# Patient Record
Sex: Male | Born: 1975 | Race: Black or African American | Hispanic: No | Marital: Single | Smoking: Current every day smoker
Health system: Southern US, Community
[De-identification: ages and names within clinical notes are randomized; demographics above are authoritative.]

## PROBLEM LIST (undated history)

## (undated) DIAGNOSIS — I10 Essential (primary) hypertension: Secondary | ICD-10-CM

## (undated) HISTORY — PX: FOOT SURGERY: SHX648

---

## 2012-10-16 ENCOUNTER — Encounter (HOSPITAL_BASED_OUTPATIENT_CLINIC_OR_DEPARTMENT_OTHER): Payer: Self-pay | Admitting: Emergency Medicine

## 2012-10-16 ENCOUNTER — Emergency Department (HOSPITAL_BASED_OUTPATIENT_CLINIC_OR_DEPARTMENT_OTHER)
Admission: EM | Admit: 2012-10-16 | Discharge: 2012-10-16 | Disposition: A | Discharge status: Home / Self Care | Payer: Self-pay | Attending: Emergency Medicine | Admitting: Emergency Medicine

## 2012-10-16 DIAGNOSIS — F17200 Nicotine dependence, unspecified, uncomplicated: Secondary | ICD-10-CM | POA: Insufficient documentation

## 2012-10-16 DIAGNOSIS — H109 Unspecified conjunctivitis: Secondary | ICD-10-CM | POA: Insufficient documentation

## 2012-10-16 MED ORDER — DIPHENHYDRAMINE HCL 12.5 MG/5ML PO ELIX
12.5000 mg | ORAL_SOLUTION | Freq: Once | ORAL | Status: AC
  Administered 2012-10-16: 25 mg via ORAL
  Filled 2012-10-16: qty 10

## 2012-10-16 MED ORDER — FLUORESCEIN SODIUM 1 MG OP STRP
1.0000 | ORAL_STRIP | Freq: Once | OPHTHALMIC | Status: AC
  Administered 2012-10-16: 1 via OPHTHALMIC
  Filled 2012-10-16: qty 1

## 2012-10-16 MED ORDER — TETRACAINE HCL 0.5 % OP SOLN
1.0000 [drp] | Freq: Once | OPHTHALMIC | Status: AC
  Administered 2012-10-16: 1 [drp] via OPHTHALMIC
  Filled 2012-10-16: qty 4

## 2012-10-16 MED ORDER — CIPROFLOXACIN HCL 0.3 % OP SOLN: 1.0000 [drp] | OPHTHALMIC | Status: DC

## 2012-10-16 NOTE — ED Notes (Signed)
Left eye is painful and itching. Wakes with drainage in the mornings.

## 2012-10-16 NOTE — ED Provider Notes (Signed)
CSN: [id number]     Arrival date & time 10/16/12  1457 History   First MD Initiated Contact with Patient 10/16/12 1516     Chief Complaint  Patient presents with  . Conjunctivitis    HPI  Ethan Marshall is a 37 y.o. male with a no PMH who presents to the ED for evaluation of conjunctivits.  History was provided by the patient.  Patient states that he has had itching and discomfort in his left eye for the past 2 or 3 days. He denies any severe eye pain or photophobia. He has mild blurry vision in his left eye.  He states he woke up with clear discharge and denies any purulent discharge. He denies any dry or increased tearing. He states he's been rubbing his eyes. He was giving saline drops at work which he has been taking with no relief in his symptoms. He denies any other treatments. He denies any trauma to the eye or possible foreign body. He does not wear glasses or contacts.  He states he has a history of seasonal allergies however her symptoms today are not similar. He denies any other associated symptoms including fever, chills, rhinorrhea, congestion, sore throat, ear pain, headache or neck pain. He states he had similar symptoms a few years ago and was prescribed some antibiotics and his symptoms improved.     History reviewed. No pertinent past medical history. History reviewed. No pertinent past surgical history. History reviewed. No pertinent family history. History  Substance Use Topics  . Smoking status: Current Every Day Smoker -- 1.00 packs/day    Types: Cigarettes  . Smokeless tobacco: Not on file  . Alcohol Use: Yes    Review of Systems  Constitutional: Negative for fever, chills, activity change, appetite change and fatigue.  HENT: Negative for ear pain, rhinorrhea and sore throat.   Eyes: Positive for discharge, redness, itching and visual disturbance. Negative for photophobia and pain.  Respiratory: Negative for cough and shortness of breath.   Cardiovascular:  Negative for chest pain.  Gastrointestinal: Negative for nausea, vomiting and abdominal pain.  Genitourinary: Negative for dysuria.  Musculoskeletal: Negative for back pain, gait problem, myalgias and neck pain.  Skin: Negative for rash and wound.  Neurological: Negative for dizziness, syncope, weakness, numbness and headaches.    Allergies  Review of patient's allergies indicates no known allergies.  Home Medications  No current outpatient prescriptions on file. BP 138/94  Pulse 83  Temp(Src) 98.7 F (37.1 C) (Oral)  Resp 20  Ht 5\' 11"  (1.803 m)  Wt 230 lb (104.327 kg)  BMI 32.09 kg/m2  SpO2 98%  Filed Vitals:   10/16/12 1508  BP: 138/94  Pulse: 83  Temp: 98.7 F (37.1 C)  TempSrc: Oral  Resp: 20  Height: 5\' 11"  (1.803 m)  Weight: 230 lb (104.327 kg)  SpO2: 98%    Physical Exam  Nursing note and vitals reviewed. Constitutional: He is oriented to person, place, and time. He appears well-developed and well-nourished. No distress.  HENT:  Head: Normocephalic and atraumatic.  Right Ear: External ear normal.  Left Ear: External ear normal.  Nose: Nose normal.  Mouth/Throat: Oropharynx is clear and moist. No oropharyngeal exudate.  TM's gray and translucent bilaterally  Eyes: EOM are normal. Pupils are equal, round, and reactive to light. Right eye exhibits no discharge. Left eye exhibits no discharge.  Sclera injected in the left eye throughout. No palpable masses to the eyelids of the left eye. No pain with eye  movement. No periorbital edema. No evidence of foreign body. No eye drainage.   Neck: Normal range of motion. Neck supple.  Cardiovascular: Normal rate, regular rhythm, normal heart sounds and intact distal pulses.  Exam reveals no gallop and no friction rub.   No murmur heard. Pulmonary/Chest: Effort normal and breath sounds normal. No respiratory distress. He has no wheezes. He has no rales. He exhibits no tenderness.  Abdominal: Soft. Bowel sounds are  normal. He exhibits no distension. There is no tenderness.  Musculoskeletal: Normal range of motion. He exhibits no edema and no tenderness.  Neurological: He is alert and oriented to person, place, and time.  Skin: Skin is warm and dry. He is not diaphoretic.    ED Course  Procedures (including critical care time) Labs Review Labs Reviewed - No data to display Imaging Review No results found.  EKG Interpretation   None       MDM   1. Conjunctivitis of left eye     Ethan Marshall is a 37 y.o. male with a no PMH who presents to the ED for evaluation of conjunctivits. Tetracaine drops ordered. Fluorescein strips. Benadryl ordered for pruritis.    Rechecks  4:11 PM = Visual Acuity: Bilateral Near: 20/70.  R: near 20/70 ; L Near: 20/100  4:30 PM = Woods lamp performed at bedside. Diffuse uptake to the inferior portion of the left eye due to irritation. No evidence of a corneal abrasion. Pruritis not improved.    Patient likely has conjunctivitis with bacterial vs viral etiology.  Patient was afebrile, non-toxic, and had no eyelid swelling or pain with eye movement.  Patient was prescribed Ciloxan for outpatient management.  He was given referral to ophthalmology and instructed to make an appointment for follow-up. Patient was instructed to return to the ED if they experience any fever, spreading redness/swelling, pain with eye movement, or other concerns.  Patient was in agreement with discharge and plan.     Final impressions: 1. Conjunctivitis, left      Ethan Iron PA-C   This patient was discussed with Dr. Anitra Lauth who evaluated the patient         Ethan Ledger, PA-C 10/18/12 1243

## 2012-10-16 NOTE — ED Notes (Signed)
Pt c/o left eye itching, bunring w drainage onset Saturday  Denies inj

## 2012-10-22 NOTE — ED Provider Notes (Signed)
Medical screening examination/treatment/procedure(s) were performed by non-physician practitioner and as supervising physician I was immediately available for consultation/collaboration.   Gwyneth Sprout, MD 10/22/12 [phone]

## 2012-11-18 ENCOUNTER — Emergency Department (HOSPITAL_BASED_OUTPATIENT_CLINIC_OR_DEPARTMENT_OTHER): Payer: Self-pay

## 2012-11-18 ENCOUNTER — Emergency Department (HOSPITAL_BASED_OUTPATIENT_CLINIC_OR_DEPARTMENT_OTHER)
Admission: EM | Admit: 2012-11-18 | Discharge: 2012-11-18 | Disposition: A | Discharge status: Home / Self Care | Payer: Self-pay | Attending: Emergency Medicine | Admitting: Emergency Medicine

## 2012-11-18 ENCOUNTER — Encounter (HOSPITAL_BASED_OUTPATIENT_CLINIC_OR_DEPARTMENT_OTHER): Payer: Self-pay | Admitting: Emergency Medicine

## 2012-11-18 DIAGNOSIS — M79609 Pain in unspecified limb: Secondary | ICD-10-CM | POA: Insufficient documentation

## 2012-11-18 DIAGNOSIS — M79642 Pain in left hand: Secondary | ICD-10-CM

## 2012-11-18 DIAGNOSIS — F17200 Nicotine dependence, unspecified, uncomplicated: Secondary | ICD-10-CM | POA: Insufficient documentation

## 2012-11-18 MED ORDER — IBUPROFEN 800 MG PO TABS: 800.0000 mg | ORAL_TABLET | Freq: Three times a day (TID) | ORAL | Status: DC

## 2012-11-18 NOTE — ED Provider Notes (Signed)
  Medical screening examination/treatment/procedure(s) were performed by non-physician practitioner and as supervising physician I was immediately available for consultation/collaboration.  EKG Interpretation   None          Fadia Marlar, MD 11/18/12 1929 

## 2012-11-18 NOTE — ED Provider Notes (Signed)
CSN: [id number]     Arrival date & time 11/18/12  1417 History   First MD Initiated Contact with Patient 11/18/12 1523     Chief Complaint  Patient presents with  . Hand Pain   (Consider location/radiation/quality/duration/timing/severity/associated sxs/prior Treatment) Patient is a 37 y.o. male presenting with hand pain. The history is provided by the patient. No language interpreter was used.  Hand Pain The current episode started more than 1 month ago. The problem has been gradually worsening. Pertinent negatives include no chills or fever. Associated symptoms comments: Left hand discomfort with mild swelling affecting wrist and tingling into digits 1-3 of hand. No weakness. He does Building surveyor work, is right hand dominant..    No past medical history on file. Past Surgical History  Procedure Laterality Date  . Foot surgery     No family history on file. History  Substance Use Topics  . Smoking status: Current Every Day Smoker -- 1.00 packs/day    Types: Cigarettes  . Smokeless tobacco: Not on file  . Alcohol Use: Yes    Review of Systems  Constitutional: Negative for fever and chills.  Musculoskeletal: Negative.        See HPI  Skin: Negative.   Neurological: Negative.     Allergies  Review of patient's allergies indicates no known allergies.  Home Medications  No current outpatient prescriptions on file. BP 149/84  Pulse 72  Temp(Src) 98 F (36.7 C) (Oral)  Resp 16  Wt 225 lb (102.059 kg)  SpO2 99% Physical Exam  Constitutional: He is oriented to person, place, and time. He appears well-developed and well-nourished.  Neck: Normal range of motion.  Pulmonary/Chest: Effort normal.  Abdominal: Soft.  Musculoskeletal: Normal range of motion.  Left hand unremarkable in appearance. No swelling, discoloration. FROM. Phalen's and Tinnel's tests negative.   Neurological: He is alert and oriented to person, place, and time.  Skin: Skin is warm and dry.  Psychiatric:  He has a normal mood and affect.    ED Course  Procedures (including critical care time) Labs Review Labs Reviewed - No data to display Imaging Review Dg Hand Complete Left  11/18/2012   CLINICAL DATA:  Left hand numbness for several months  EXAM: LEFT HAND - COMPLETE 3+ VIEW  COMPARISON:  None.  FINDINGS: There is no evidence of fracture or dislocation. There is no evidence of arthropathy or other focal bone abnormality. Soft tissues are unremarkable.  IMPRESSION: No acute osseous injury of the left hand.   Electronically Signed   By: Elige Ko   On: 11/18/2012 15:15    EKG Interpretation   None       MDM  No diagnosis found. Left hand pain  Nonspecific hand pain, possibly early Carpal Tunnel without exam findings to support diagnosis at this time. REcommend supportive care, hand follow up if symptoms worsen.    Arnoldo Hooker, PA-C 11/18/12 1622

## 2012-11-18 NOTE — ED Notes (Signed)
Pt having pain to left hand for two months, worse recently.  Pt states he does have some numbness in hand.  Pt works in Building surveyor. Good CMS.

## 2013-11-19 ENCOUNTER — Emergency Department (HOSPITAL_BASED_OUTPATIENT_CLINIC_OR_DEPARTMENT_OTHER): Payer: Self-pay

## 2013-11-19 ENCOUNTER — Emergency Department (HOSPITAL_BASED_OUTPATIENT_CLINIC_OR_DEPARTMENT_OTHER)
Admission: EM | Admit: 2013-11-19 | Discharge: 2013-11-19 | Disposition: A | Discharge status: Home / Self Care | Payer: Self-pay | Attending: Emergency Medicine | Admitting: Emergency Medicine

## 2013-11-19 ENCOUNTER — Encounter (HOSPITAL_BASED_OUTPATIENT_CLINIC_OR_DEPARTMENT_OTHER): Payer: Self-pay | Admitting: Emergency Medicine

## 2013-11-19 DIAGNOSIS — Z720 Tobacco use: Secondary | ICD-10-CM | POA: Insufficient documentation

## 2013-11-19 DIAGNOSIS — R079 Chest pain, unspecified: Secondary | ICD-10-CM | POA: Insufficient documentation

## 2013-11-19 DIAGNOSIS — M25511 Pain in right shoulder: Secondary | ICD-10-CM | POA: Insufficient documentation

## 2013-11-19 DIAGNOSIS — R600 Localized edema: Secondary | ICD-10-CM | POA: Insufficient documentation

## 2013-11-19 MED ORDER — NAPROXEN 500 MG PO TABS
500.0000 mg | ORAL_TABLET | Freq: Two times a day (BID) | ORAL | Status: AC
Start: 2013-11-19 — End: ?

## 2013-11-19 NOTE — ED Notes (Signed)
Pt states he is having right shoulder pain and swollen area to right clavicle since Saturday.  Does play softball.

## 2013-11-19 NOTE — Discharge Instructions (Signed)
Symptoms and exam are concerning for rotator cuff injury to the right shoulder. Will treat with shoulder immobilizer. Follow-up with sports medicine. Chest x-ray and x-ray of the shoulders negative for any bony problems. For the swelling at the head of the clavicle effect gets worse return.

## 2013-11-19 NOTE — ED Notes (Signed)
MD at bedside. 

## 2013-11-19 NOTE — ED Provider Notes (Signed)
CSN: [id number]     Arrival date & time 11/19/13  [id number] History   First MD Initiated Contact with Patient 11/19/13 0912     Chief Complaint  Patient presents with  . Shoulder Pain  . Edema     (Consider location/radiation/quality/duration/timing/severity/associated sxs/prior Treatment) Patient is a 38 y.o. male presenting with shoulder pain. The history is provided by the patient.  Shoulder Pain Associated symptoms: no back pain, no fever and no neck pain   patient would complain of right shoulder pain for about a month. Patient is right-hand dominant. Patient does play softball. Shoulder pain is not improving. Made worse by putting his arm above his head. Also develop some redness at the clavicle where joints the sternum. That's been present just for a few days. No direct injury. Only mild pain at the clavicle area. The shoulder pain can be 8 out of 10. Denies any numbness to the fingers. Occasionally shoulder pain will radiate down the arm but not to the fingers. Shoulder pain made worse by range of motion. The pain is a sharp ache.  No past medical history on file. Past Surgical History  Procedure Laterality Date  . Foot surgery     No family history on file. History  Substance Use Topics  . Smoking status: Current Every Day Smoker -- 1.00 packs/day    Types: Cigarettes  . Smokeless tobacco: Not on file  . Alcohol Use: Yes    Review of Systems  Constitutional: Negative for fever.  HENT: Negative for congestion.   Eyes: Negative for redness.  Respiratory: Negative for shortness of breath.   Cardiovascular: Positive for chest pain.  Gastrointestinal: Negative for abdominal pain.  Genitourinary: Negative for dysuria.  Musculoskeletal: Negative for back pain and neck pain.  Skin: Negative for rash.  Neurological: Negative for weakness, numbness and headaches.  Hematological: Does not bruise/bleed easily.  Psychiatric/Behavioral: Negative for confusion.      Allergies   Review of patient's allergies indicates no known allergies.  Home Medications   Prior to Admission medications   Medication Sig Start Date End Date Taking? Authorizing Provider  naproxen (NAPROSYN) 500 MG tablet Take 1 tablet (500 mg total) by mouth 2 (two) times daily. 11/19/13   Vanetta MuldersScott Ashely Joshua, MD   BP 161/101 mmHg  Pulse 66  Temp(Src) 99.3 F (37.4 C) (Oral)  Resp 16  Ht 5\' 11"  (1.803 m)  Wt 225 lb (102.059 kg)  BMI 31.39 kg/m2  SpO2 97% Physical Exam  Constitutional: He is oriented to person, place, and time. He appears well-developed and well-nourished. No distress.  HENT:  Mouth/Throat: Oropharynx is clear and moist.  Eyes: Conjunctivae and EOM are normal. Pupils are equal, round, and reactive to light.  Neck: Normal range of motion. Neck supple.  Cardiovascular: Normal rate, regular rhythm and normal heart sounds.   No murmur heard. Pulmonary/Chest: Breath sounds normal. No respiratory distress.  At the head of the right clavicle going into the sternum there is an area of redness measuring about 3 cm. No induration no fluctuance. Nontender to palpation.  Abdominal: Soft. Bowel sounds are normal. There is tenderness.  Musculoskeletal: He exhibits tenderness.  Decreased range of motion of the right shoulder particularly putting arm above head. Decreased range of motion is due to pain. Radial pulse distally is 2+. Sensations intact to the hand.  Lymphadenopathy:    He has no cervical adenopathy.  Neurological: He is alert and oriented to person, place, and time. No cranial nerve deficit. He exhibits  normal muscle tone. Coordination normal.  Skin: Skin is warm. There is erythema.  Nursing note and vitals reviewed.   ED Course  Procedures (including critical care time) Labs Review Labs Reviewed - No data to display  Imaging Review Dg Chest 2 View  11/19/2013   CLINICAL DATA:  38 year old male with palpable lump on right clavicle accompanied by right-sided chest pain   EXAM: CHEST  2 VIEW  COMPARISON:  Concurrently obtained radiographs of the right shoulder  FINDINGS: The lungs are clear and negative for focal airspace consolidation, pulmonary edema or suspicious pulmonary nodule. No pleural effusion or pneumothorax. Cardiac and mediastinal contours are within normal limits. No acute fracture or lytic or blastic osseous lesions. The visualized upper abdominal bowel gas pattern is unremarkable.  IMPRESSION: Negative chest x-ray.   Electronically Signed   By: Malachy MoanHeath  McCullough M.D.   On: 11/19/2013 10:59   Dg Shoulder Right  11/19/2013   CLINICAL DATA:  38 year old male with palpable abnormality on the right clavicle discovered 2 weeks ago associated with some right chest pain. Initial encounter.  EXAM: RIGHT SHOULDER - 2+ VIEW  COMPARISON:  None.  FINDINGS: Bone mineralization is within normal limits. The right clavicle appears intact, but the sternoclavicular joint is not well visualized. No glenohumeral joint dislocation. Proximal right humerus intact. Right scapula intact. Visible right ribs and lung parenchyma within normal limits.  IMPRESSION: No acute osseous abnormality identified about the right shoulder.  No focal abnormality of the right clavicle identified. Considered neck CT (IV contrast preferred) if further imaging evaluation is necessary.   Electronically Signed   By: Augusto GambleLee  Hall M.D.   On: 11/19/2013 10:55     EKG Interpretation None      MDM   Final diagnoses:  Shoulder pain, acute, right    By history and physical exam right shoulder concerning for rotator cuff injury. Will treat with sling and anti-inflammatories and referral to sports medicine. X-rays of the shoulder without any bony abnormalities. Chest x-ray and examination of the clavicle area also negative. Possible the redness there could be inflammation at the joint clavicle into the sternum or could be a developing skin infection. Nothing that is obvious at this point in time. We'll not  treat with antibiotics at this point. Will treat with just anti-inflammatories.    Vanetta MuldersScott Chipper Koudelka, MD 11/19/13 1119

## 2014-12-12 IMAGING — CR DG SHOULDER 2+V*R*
3 series · 3 of 3 positions shown · non-contrast
Comparison: None.

CLINICAL DATA: 38-year-old male with palpable abnormality on the
right clavicle discovered 2 weeks ago associated with some right
chest pain. Initial encounter.

EXAM:
RIGHT SHOULDER - 2+ VIEW

[w shoulder ap internal righ]
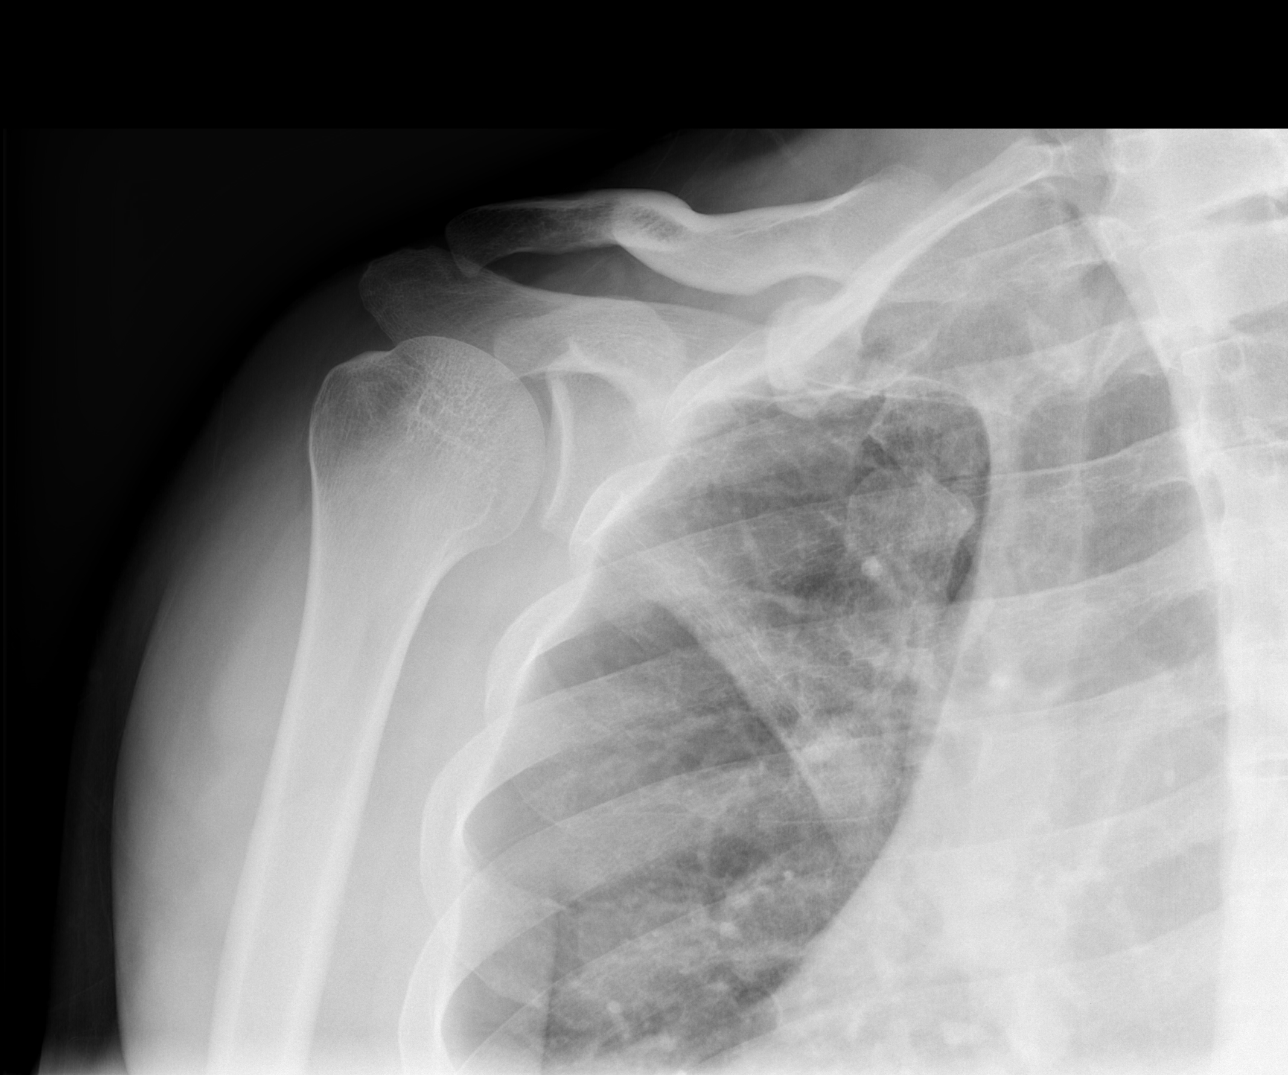

[w shoulder y view right]
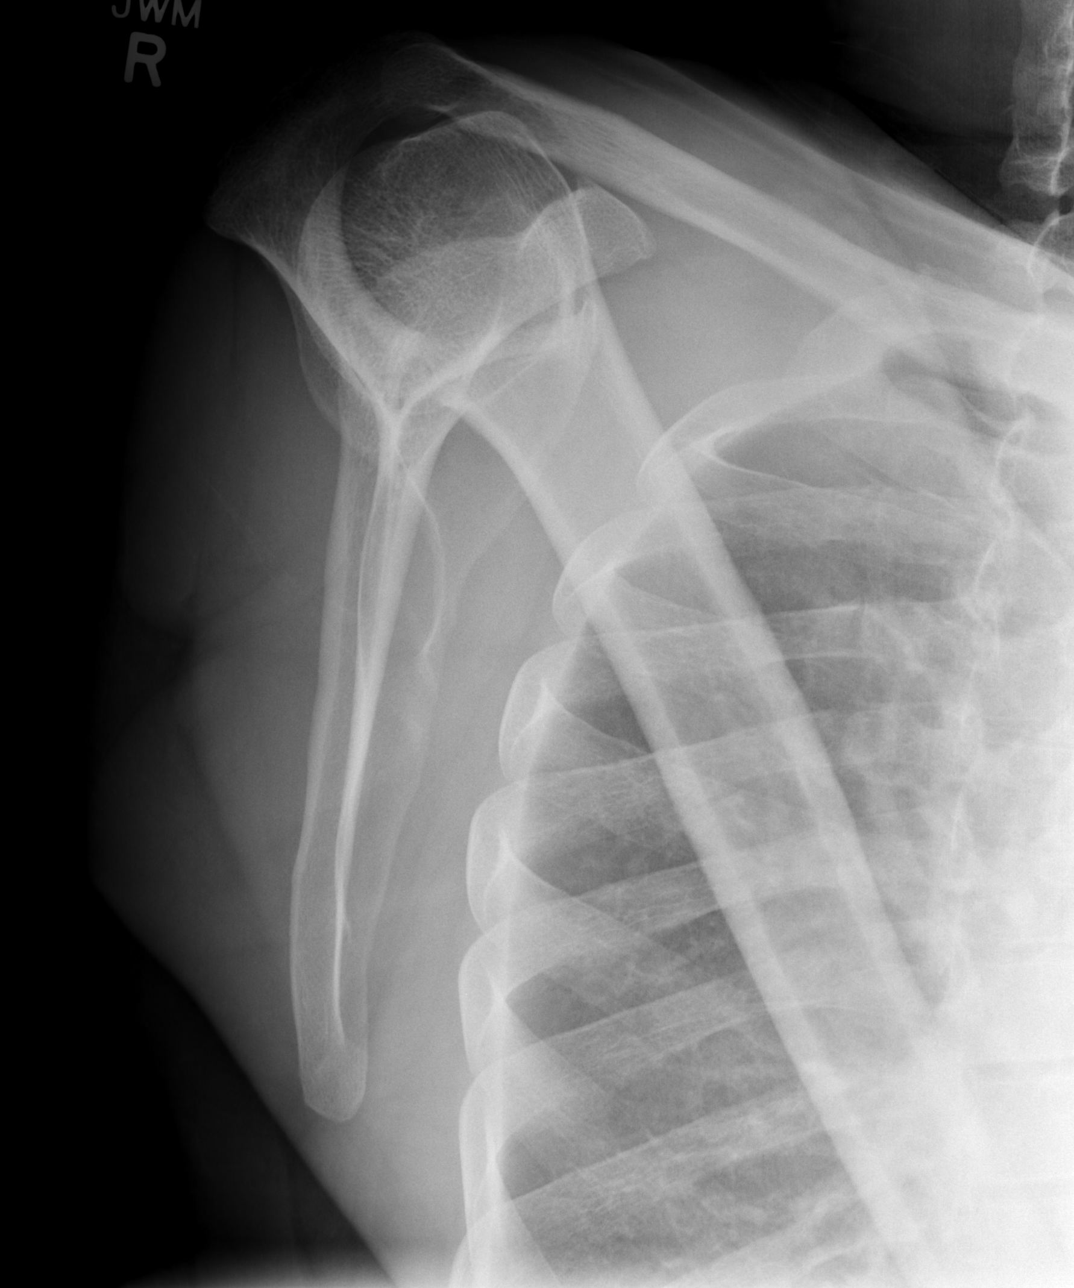

[x shoulder axillary right]
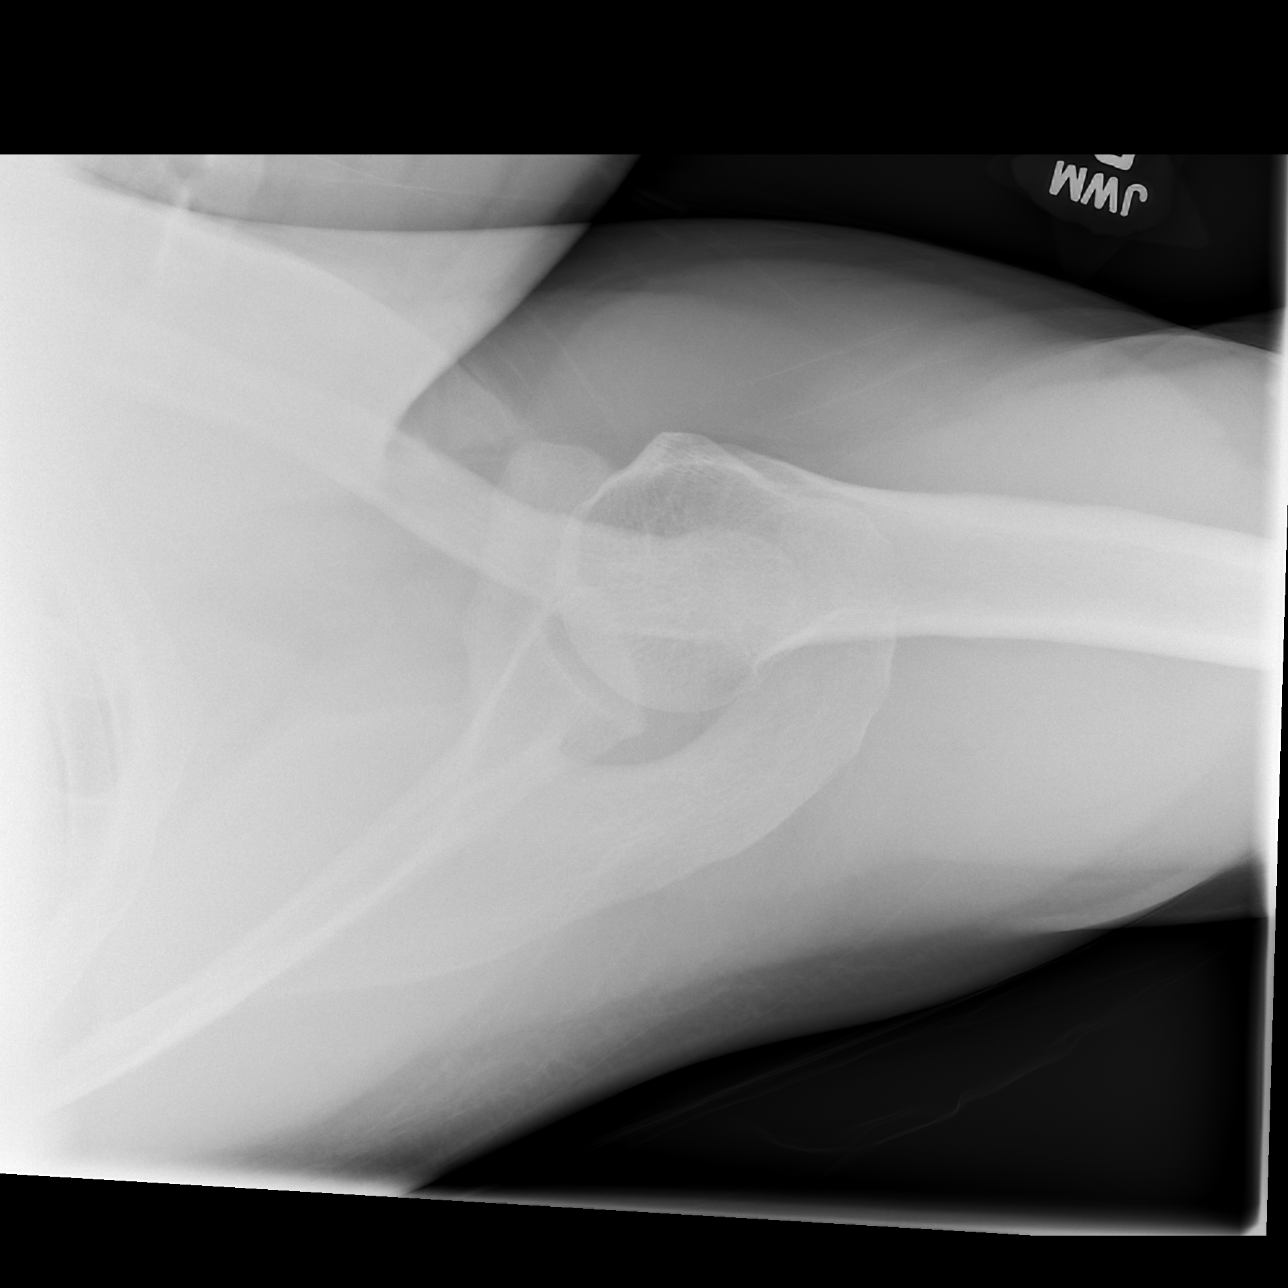

[3 of 3 positions shown; findings below may reference images not displayed]

FINDINGS: Bone mineralization is within normal limits. The right clavicle
appears intact, but the sternoclavicular joint is not well
visualized. No glenohumeral joint dislocation. Proximal right
humerus intact. Right scapula intact. Visible right ribs and lung
parenchyma within normal limits.
IMPRESSION: No acute osseous abnormality identified about the right shoulder.

No focal abnormality of the right clavicle identified. Considered
neck CT (IV contrast preferred) if further imaging evaluation is
necessary.

## 2022-04-19 ENCOUNTER — Encounter (HOSPITAL_BASED_OUTPATIENT_CLINIC_OR_DEPARTMENT_OTHER): Payer: Self-pay

## 2022-04-19 ENCOUNTER — Emergency Department (HOSPITAL_BASED_OUTPATIENT_CLINIC_OR_DEPARTMENT_OTHER)
Admission: EM | Admit: 2022-04-19 | Discharge: 2022-04-19 | Disposition: A | Discharge status: Home / Self Care | Payer: 59 | Attending: Emergency Medicine | Admitting: Emergency Medicine

## 2022-04-19 ENCOUNTER — Emergency Department (HOSPITAL_BASED_OUTPATIENT_CLINIC_OR_DEPARTMENT_OTHER): Payer: 59

## 2022-04-19 DIAGNOSIS — D72829 Elevated white blood cell count, unspecified: Secondary | ICD-10-CM | POA: Diagnosis not present

## 2022-04-19 DIAGNOSIS — N452 Orchitis: Secondary | ICD-10-CM | POA: Diagnosis not present

## 2022-04-19 DIAGNOSIS — N50819 Testicular pain, unspecified: Secondary | ICD-10-CM | POA: Diagnosis present

## 2022-04-19 LAB — COMPREHENSIVE METABOLIC PANEL
ALT: 20 U/L (ref 0–44)
AST: 18 U/L (ref 15–41)
Albumin: 4.4 g/dL (ref 3.5–5.0)
Alkaline Phosphatase: 70 U/L (ref 38–126)
Anion gap: 10 (ref 5–15)
BUN: 10 mg/dL (ref 6–20)
CO2: 25 mmol/L (ref 22–32)
Calcium: 8.9 mg/dL (ref 8.9–10.3)
Chloride: 101 mmol/L (ref 98–111)
Creatinine, Ser: 0.93 mg/dL (ref 0.61–1.24)
GFR, Estimated: >60 mL/min (ref >60)
Glucose, Bld: 114 mg/dL — ABNORMAL HIGH (ref 70–99)
Potassium: 3.5 mmol/L (ref 3.5–5.1)
Sodium: 136 mmol/L (ref 135–145)
Total Bilirubin: 0.8 mg/dL (ref 0.3–1.2)
Total Protein: 8 g/dL (ref 6.5–8.1)

## 2022-04-19 LAB — URINALYSIS, MICROSCOPIC (REFLEX)

## 2022-04-19 LAB — URINALYSIS, ROUTINE W REFLEX MICROSCOPIC
Bilirubin Urine: NEGATIVE
Glucose, UA: NEGATIVE mg/dL
Ketones, ur: NEGATIVE mg/dL
Leukocytes,Ua: NEGATIVE
Nitrite: NEGATIVE
Protein, ur: NEGATIVE mg/dL
Specific Gravity, Urine: 1.02 (ref 1.005–1.030)
pH: 6 (ref 5.0–8.0)

## 2022-04-19 LAB — CBC WITH DIFFERENTIAL/PLATELET
Abs Immature Granulocytes: 0.03 10*3/uL (ref 0.00–0.07)
Basophils Absolute: 0.1 10*3/uL (ref 0.0–0.1)
Basophils Relative: 1 %
Eosinophils Absolute: 0 10*3/uL (ref 0.0–0.5)
Eosinophils Relative: 0 %
HCT: 49.8 % (ref 39.0–52.0)
Hemoglobin: 16.6 g/dL (ref 13.0–17.0)
Immature Granulocytes: 0 %
Lymphocytes Relative: 24 %
Lymphs Abs: 2.9 10*3/uL (ref 0.7–4.0)
MCH: 28.3 pg (ref 26.0–34.0)
MCHC: 33.3 g/dL (ref 30.0–36.0)
MCV: 84.8 fL (ref 80.0–100.0)
Monocytes Absolute: 0.9 10*3/uL (ref 0.1–1.0)
Monocytes Relative: 8 %
Neutro Abs: 8.2 10*3/uL — ABNORMAL HIGH (ref 1.7–7.7)
Neutrophils Relative %: 67 %
Platelets: 285 10*3/uL (ref 150–400)
RBC: 5.87 MIL/uL — ABNORMAL HIGH (ref 4.22–5.81)
RDW: 13.7 % (ref 11.5–15.5)
WBC: 12.1 10*3/uL — ABNORMAL HIGH (ref 4.0–10.5)
nRBC: 0 % (ref 0.0–0.2)

## 2022-04-19 MED ORDER — OXYCODONE-ACETAMINOPHEN 5-325 MG PO TABS
1.0000 | ORAL_TABLET | Freq: Once | ORAL | Status: AC
  Administered 2022-04-19: 1 via ORAL
  Filled 2022-04-19: qty 1

## 2022-04-19 MED ORDER — OXYCODONE HCL 5 MG PO TABS: 5.0000 mg | ORAL_TABLET | Freq: Four times a day (QID) | ORAL | 0 refills | Status: AC | PRN

## 2022-04-19 MED ORDER — CIPROFLOXACIN HCL 500 MG PO TABS: 500.0000 mg | ORAL_TABLET | Freq: Two times a day (BID) | ORAL | 0 refills | Status: AC

## 2022-04-19 NOTE — ED Notes (Signed)
Pt aware of need for urine specimen, provided container 

## 2022-04-19 NOTE — ED Notes (Signed)
Patient transported to Ultrasound 

## 2022-04-19 NOTE — Discharge Instructions (Addendum)
We evaluated you for your testicle pain.  Your ultrasound today showed signs of an early testicle infection.  We have prescribed you an antibiotic called ciprofloxacin.  Please take 1 tablet twice daily for the next 2 weeks.  Please call [facility] to schedule follow-up appointment with them in 1 to 2 weeks.  Please take Tylenol and Motrin for your symptoms at home.  You can take 1000 mg of Tylenol every 6 hours and 600 mg of ibuprofen every 6 hours as needed for your symptoms.  You can take these medicines together as needed, either at the same time, or alternating every 3 hours.  I have prescribed you a small amount of oxycodone.  If your symptoms are unrelieved with Tylenol and ibuprofen, you can take an oxycodone as well.  Do not mix this with alcohol or drive while taking this medication as it may make you sleepy.  Please only take this medication if you need it.  Please return to the emergency department if you develop any new or worsening symptoms such as worsening pain, swelling, redness, fevers or chills, vomiting, or any other concerning symptoms.   If you develop constipation please buy miralax over the counter and take this 1-2 times daily. Opioids can make constipation worse

## 2022-04-19 NOTE — ED Provider Notes (Signed)
[place] [facility] AT [facility] Provider Note  CSN: [id number] Arrival date & time: 04/19/22 1147  Chief Complaint(s) Testicle Pain  HPI Ethan Marshall is a 47 y.o. male denies any past medical history presenting to the emergency department with testicular pain.  Reports he has had intermittent testicular pain for the past 2 weeks, worse over the past few days.  He reports some lower abdominal discomfort.  No dysuria.  No fevers or chills.  No testicular trauma.  No urethral discharge.  No diarrhea, constipation, painful defecation, chest pain, shortness of breath, nausea or vomiting.  He went to an outside ER few days ago, had CT scan and testicular ultrasound which he reports were normal.  He reports that pain is continued.   Past Medical History History reviewed. No pertinent past medical history. There are no problems to display for this patient.  Home Medication(s) Prior to Admission medications   Medication Sig Start Date End Date Taking? Authorizing Provider  ciprofloxacin (CIPRO) 500 MG tablet Take 1 tablet (500 mg total) by mouth every 12 (twelve) hours for 14 days. 04/19/22 05/03/22 Yes Lonell Grandchild, MD  oxyCODONE (ROXICODONE) 5 MG immediate release tablet Take 1 tablet (5 mg total) by mouth every 6 (six) hours as needed for severe pain or breakthrough pain. 04/19/22  Yes Lonell Grandchild, MD  naproxen (NAPROSYN) 500 MG tablet Take 1 tablet (500 mg total) by mouth 2 (two) times daily. 11/19/13   Vanetta Mulders, MD                                                                                                                                    Past Surgical History Past Surgical History:  Procedure Laterality Date   FOOT SURGERY     Family History History reviewed. No pertinent family history.  Social History Social History   Tobacco Use   Smoking status: Every Day    Packs/day: 1    Types: Cigarettes  Vaping Use   Vaping Use: Never  used  Substance Use Topics   Alcohol use: Yes   Drug use: Yes    Types: Marijuana   Allergies Patient has no known allergies.  Review of Systems Review of Systems  All other systems reviewed and are negative.   Physical Exam Vital Signs  I have reviewed the triage vital signs BP (!) 175/138   Pulse 92   Temp 98.3 F (36.8 C) (Oral)   Resp 17   Ht 5\' 11"  (1.803 m)   Wt 111.1 kg   SpO2 93%   BMI 34.17 kg/m  Physical Exam Vitals and nursing note reviewed.  Constitutional:      General: He is not in acute distress.    Appearance: Normal appearance.  HENT:     Mouth/Throat:     Mouth: Mucous membranes are moist.  Eyes:     Conjunctiva/sclera: Conjunctivae normal.  Cardiovascular:  Rate and Rhythm: Normal rate and regular rhythm.  Pulmonary:     Effort: Pulmonary effort is normal. No respiratory distress.     Breath sounds: Normal breath sounds.  Abdominal:     General: Abdomen is flat.     Palpations: Abdomen is soft.     Tenderness: There is no abdominal tenderness.  Genitourinary:    Comments: Chaperoned by EMT.  Mild bilateral testicular tenderness.  No scrotal wounds or inguinal wounds, no perineal wounds.  Normal external male genitalia.  No palpable hernias. Musculoskeletal:     Right lower leg: No edema.     Left lower leg: No edema.  Skin:    General: Skin is warm and dry.     Capillary Refill: Capillary refill takes less than 2 seconds.  Neurological:     Mental Status: He is alert and oriented to person, place, and time. Mental status is at baseline.  Psychiatric:        Mood and Affect: Mood normal.        Behavior: Behavior normal.     ED Results and Treatments Labs (all labs ordered are listed, but only abnormal results are displayed) Labs Reviewed  COMPREHENSIVE METABOLIC PANEL - Abnormal; Notable for the following components:      Result Value   Glucose, Bld 114 (*)    All other components within normal limits  CBC WITH  DIFFERENTIAL/PLATELET - Abnormal; Notable for the following components:   WBC 12.1 (*)    RBC 5.87 (*)    Neutro Abs 8.2 (*)    All other components within normal limits  URINALYSIS, ROUTINE W REFLEX MICROSCOPIC - Abnormal; Notable for the following components:   Hgb urine dipstick TRACE (*)    All other components within normal limits  URINALYSIS, MICROSCOPIC (REFLEX) - Abnormal; Notable for the following components:   Bacteria, UA RARE (*)    All other components within normal limits  GC/CHLAMYDIA PROBE AMP ([place]) NOT AT [facility]                                                                                                                          Radiology US SCROTUM W/DOPPLER  Result Date: 04/19/2022 CLINICAL DATA:  47 year old with left testicular pain. EXAM: SCROTAL ULTRASOUND DOPPLER ULTRASOUND OF THE TESTICLES TECHNIQUE: Complete ultrasound examination of the testicles, epididymis, and other scrotal structures was performed. Color and spectral Doppler ultrasound were also utilized to evaluate blood flow to the testicles. COMPARISON:  Outside report from [facility] dated 04/16/2022 FINDINGS: Right testicle Measurements: 4.4 x 2.7 x 3.3 cm. No mass or microlithiasis visualized. Normal echogenicity. Left testicle Measurements: 4.2 x 2.3 x 3.1 cm. Left testicle is heterogeneous with patchy areas of hypoechogenicity. There appears to be paucity of color Doppler flow within the central aspect of the left testicle and associated with the heterogeneous echotexture. The vascular flow appears to be more concentrated along the periphery of the testicle. Right epididymis:  Normal in size  and appearance. Left epididymis:  Normal in size and appearance. Hydrocele:  None visualized. Varicocele:  Question a small left varicocele. Pulsed Doppler interrogation of both testes demonstrates normal low resistance arterial and venous waveforms bilaterally. IMPRESSION: 1. Abnormal appearance of the left testicle.  Left testicle is heterogeneous and the blood flow is more concentrated along the periphery of the testicle. There is not a discrete lesion within left testicle but findings concerning for infectious or inflammatory process in central aspect of the left testicle. Cannot exclude phlegmon or developing abscess within the central aspect of the testicle. An infiltrative neoplasm cannot be completely excluded. Recommend urology consultation and consider follow-up ultrasound imaging. 2. Normal appearance of the right testicle. 3. Question a small left varicocele. Electronically Signed   By: Richarda Overlie M.D.   On: 04/19/2022 14:42    Pertinent labs & imaging results that were available during my care of the patient were reviewed by me and considered in my medical decision making (see MDM for details).  Medications Ordered in ED Medications  oxyCODONE-acetaminophen (PERCOCET/ROXICET) 5-325 MG per tablet 1 tablet (1 tablet Oral Given 04/19/22 1259)                                                                                                                                     Procedures Procedures  (including critical care time)  Medical Decision Making / ED Course   MDM:  47 year old male presenting to the emergency department with testicular pain.  Patient exam notable for mild bilateral testicular tenderness.  Differential includes epididymitis, torsion, abscess.  Will obtain ultrasound to further evaluate.  Reviewed outside records, patient did have a normal CT scan and normal testicular ultrasound few days ago, but given worsening pain will repeat ultrasound.  He has no abdominal tenderness, no hernia seen on CT scan and no palpable hernias, doubt that repeat CT scan would be beneficial.  Obtain basic labs notable for mild leukocytosis.  Pending urinalysis.  Will reassess.  Clinical Course as of 04/19/22 1518  Tue Apr 19, 2022  1502 BMI (Calculated): 34.19 [WS]  1515 Ultrasound demonstrates  possible early orchitis, differential per read also includes inflammatory or neoplasm.  No abscess.  GC probe from [facility] negative.  Discussed with Dr. Alvester Morin, urology, agrees with outpatient follow-up.  Recommends antibiotics, follow-up with them in around 2 weeks.  Will give patient a short course of oxycodone and discussed nonopioid pain relief. [place] Controlled Substance Reporting System database was reviewed. and patient was instructed, not to drive, operate heavy machinery, perform activities at heights, swimming or participation in water activities or provide baby-sitting services while on Pain, Sleep and Anxiety Medications; until their outpatient Physician has advised to do so again. Also recommended to not to take more than prescribed Pain, Sleep and Anxiety Medications. Will discharge patient to home. All questions answered. Patient comfortable with plan of discharge. Return precautions discussed with patient and  specified on the after visit summary.  [WS]    Clinical Course User Index [WS] Lonell Grandchild, MD     Additional history obtained: -External records from outside source obtained and reviewed including: Chart review including previous notes, labs, imaging, consultation notes including [facility] records   Lab Tests: -I ordered, reviewed, and interpreted labs.   The pertinent results include:   Labs Reviewed  COMPREHENSIVE METABOLIC PANEL - Abnormal; Notable for the following components:      Result Value   Glucose, Bld 114 (*)    All other components within normal limits  CBC WITH DIFFERENTIAL/PLATELET - Abnormal; Notable for the following components:   WBC 12.1 (*)    RBC 5.87 (*)    Neutro Abs 8.2 (*)    All other components within normal limits  URINALYSIS, ROUTINE W REFLEX MICROSCOPIC - Abnormal; Notable for the following components:   Hgb urine dipstick TRACE (*)    All other components within normal limits  URINALYSIS, MICROSCOPIC (REFLEX) - Abnormal;  Notable for the following components:   Bacteria, UA RARE (*)    All other components within normal limits  GC/CHLAMYDIA PROBE AMP ([place]) NOT AT [facility]    Notable for mild leukocytosis.  Imaging Studies ordered: I ordered imaging studies including US scrotum On my interpretation imaging demonstrates possible orchitis I independently visualized and interpreted imaging. I agree with the radiologist interpretation   Medicines ordered and prescription drug management: Meds ordered this encounter  Medications   oxyCODONE-acetaminophen (PERCOCET/ROXICET) 5-325 MG per tablet 1 tablet   oxyCODONE (ROXICODONE) 5 MG immediate release tablet    Sig: Take 1 tablet (5 mg total) by mouth every 6 (six) hours as needed for severe pain or breakthrough pain.    Dispense:  12 tablet    Refill:  0   ciprofloxacin (CIPRO) 500 MG tablet    Sig: Take 1 tablet (500 mg total) by mouth every 12 (twelve) hours for 14 days.    Dispense:  28 tablet    Refill:  0    -I have reviewed the patients home medicines and have made adjustments as needed   Consultations Obtained: I requested consultation with the urologist Dr. Alvester Morin,  and discussed lab and imaging findings as well as pertinent plan - they recommend: antibiotics and outpatient follow up   Social Determinants of Health:  Diagnosis or treatment significantly limited by social determinants of health: obesity   Reevaluation: After the interventions noted above, I reevaluated the patient and found that their symptoms have improved  Co morbidities that complicate the patient evaluation History reviewed. No pertinent past medical history.    Dispostion: Disposition decision including need for hospitalization was considered, and patient discharged from emergency department.    Final Clinical Impression(s) / ED Diagnoses Final diagnoses:  Orchitis of left testicle     This chart was dictated using voice recognition software.  Despite best  efforts to proofread,  errors can occur which can change the documentation meaning.    Lonell Grandchild, MD 04/19/22 [phone]

## 2022-04-19 NOTE — ED Notes (Signed)
Pt reports unable to move bowels since friday

## 2022-04-19 NOTE — ED Triage Notes (Signed)
C/o left testicle pain x 2 weeks intermittently. Pain has worsened over the past couple days. Also c/o lower abdominal pain, constipation. Denies urinary symptoms. Went to hospital in [place] over the weekend with normal [place] & CT results.

## 2022-04-20 LAB — GC/CHLAMYDIA PROBE AMP (~~LOC~~) NOT AT ARMC
Chlamydia: NEGATIVE
Comment: NEGATIVE
Comment: NORMAL
Neisseria Gonorrhea: NEGATIVE

## 2023-12-29 ENCOUNTER — Emergency Department (HOSPITAL_BASED_OUTPATIENT_CLINIC_OR_DEPARTMENT_OTHER)
Admission: EM | Admit: 2023-12-29 | Discharge: 2023-12-29 | Disposition: A | Discharge status: Home / Self Care | Payer: Self-pay | Attending: Emergency Medicine | Admitting: Emergency Medicine

## 2023-12-29 ENCOUNTER — Other Ambulatory Visit: Payer: Self-pay

## 2023-12-29 ENCOUNTER — Encounter (HOSPITAL_BASED_OUTPATIENT_CLINIC_OR_DEPARTMENT_OTHER): Payer: Self-pay | Admitting: Emergency Medicine

## 2023-12-29 DIAGNOSIS — K047 Periapical abscess without sinus: Secondary | ICD-10-CM | POA: Insufficient documentation

## 2023-12-29 DIAGNOSIS — K0889 Other specified disorders of teeth and supporting structures: Secondary | ICD-10-CM

## 2023-12-29 HISTORY — DX: Essential (primary) hypertension: I10

## 2023-12-29 MED ORDER — KETOROLAC TROMETHAMINE 30 MG/ML IJ SOLN
30.0000 mg | Freq: Once | INTRAMUSCULAR | Status: AC
  Administered 2023-12-29: 30 mg via INTRAMUSCULAR

## 2023-12-29 MED ORDER — KETOROLAC TROMETHAMINE 30 MG/ML IJ SOLN
30.0000 mg | Freq: Once | INTRAMUSCULAR | Status: DC
  Filled 2023-12-29: qty 1

## 2023-12-29 MED ORDER — AMOXICILLIN-POT CLAVULANATE 875-125 MG PO TABS: 1.0000 | ORAL_TABLET | Freq: Two times a day (BID) | ORAL | 0 refills | Status: AC

## 2023-12-29 NOTE — Discharge Instructions (Addendum)
 Take antibiotics as prescribed.  Follow-up with dentist for further evaluation.  Take ibuprofen  or Tylenol  for pain as needed.  Return if difficulty swallowing, breathing, fever, worsening swelling.

## 2023-12-29 NOTE — ED Provider Notes (Signed)
 " [place] [facility] Provider Note   CSN: [id number] Arrival date & time: 12/29/23  1126     Patient presents with: Dental Pain and Oral Swelling   Ethan Marshall is a 48 y.o. male.  presents today to the ED with left-sided tooth pain and swelling.  He notes that the pain started a week ago but noticed an abscess a few days ago.  Denies any drainage from the past.  Patient states is difficult to eat due to pain.  He has been taking Tylenol  with minimal relief.  Patient has not seen a dentist.  Denies fevers, chills, shortness of breath, or any other symptoms at this time.      Dental Pain      Prior to Admission medications  Medication Sig Start Date End Date Taking? Authorizing Provider  amoxicillin -clavulanate (AUGMENTIN ) 875-125 MG tablet Take 1 tablet by mouth 2 (two) times daily for 7 days. 12/29/23 01/05/24 Yes Eliya Geiman, PA-C  naproxen  (NAPROSYN ) 500 MG tablet Take 1 tablet (500 mg total) by mouth 2 (two) times daily. 11/19/13   Zackowski, Scott, MD  oxyCODONE  (ROXICODONE ) 5 MG immediate release tablet Take 1 tablet (5 mg total) by mouth every 6 (six) hours as needed for severe pain or breakthrough pain. 04/19/22   Francesca Elsie CROME, MD    Allergies: Patient has no known allergies.    Review of Systems  HENT:  Positive for dental problem.     Updated Vital Signs BP (!) 185/130 (BP Location: Right Arm)   Pulse 85   Temp 98.3 F (36.8 C) (Oral)   Resp 17   Ht 5' 11 (1.803 m)   Wt 106.6 kg   SpO2 95%   BMI 32.78 kg/m   Physical Exam Vitals and nursing note reviewed.  Constitutional:      General: He is not in acute distress.    Appearance: He is well-developed.  HENT:     Head: Normocephalic and atraumatic.     Mouth/Throat:     Mouth: Mucous membranes are moist.     Dentition: Abnormal dentition.     Comments: Poor dentition, tender to palpation to right lower molar, no fluctuance appreciated.  No airway  compromise Eyes:     Conjunctiva/sclera: Conjunctivae normal.  Cardiovascular:     Rate and Rhythm: Normal rate and regular rhythm.     Heart sounds: No murmur heard. Pulmonary:     Effort: Pulmonary effort is normal. No respiratory distress.     Breath sounds: Normal breath sounds.  Abdominal:     Palpations: Abdomen is soft.     Tenderness: There is no abdominal tenderness.  Musculoskeletal:        General: No swelling.     Cervical back: Neck supple.  Skin:    General: Skin is warm and dry.     Capillary Refill: Capillary refill takes less than 2 seconds.  Neurological:     Mental Status: He is alert.  Psychiatric:        Mood and Affect: Mood normal.     (all labs ordered are listed, but only abnormal results are displayed) Labs Reviewed - No data to display  EKG: None  Radiology: No results found.   Procedures   Medications Ordered in the ED  ketorolac  (TORADOL ) 30 MG/ML injection 30 mg (has no administration in time range)  Medical Decision Making   Patient presents with left-sided tooth pain and swelling.  On exam he is hypertensive.  No signs of airway compromise.  Mild left-sided facial swelling and poor dentition on exam.  Otherwise exam is benign.  I&D not necessary at this time as the abscess is not fluctuant. Will prescribe Augmentin  and recommend patient to take ibuprofen  as needed for pain.  Suggested patient to follow-up with dentist for further evaluation.  Provided patient with Toradol  shot here.  return precautions are provided.  Patient is in agreement with plan and is stable for discharge.        Final diagnoses:  Pain, dental  Dental abscess    ED Discharge Orders          Ordered    amoxicillin -clavulanate (AUGMENTIN ) 875-125 MG tablet  2 times daily        12/29/23 1437               Conor Lata, PA-C 12/29/23 1447    Ruthe Cornet, DO 12/29/23 1450  "

## 2023-12-29 NOTE — ED Triage Notes (Addendum)
 Pt reports  RT lower toothache with swelling for the past few weeks, has not seen dentist, denies fever  HTN noted in triage, pt states he has issues with this ongoing and is not on any medications, denies any CP, ShoB or other s/s
